# Patient Record
Sex: Female | Born: 1990 | Hispanic: Yes | Marital: Single
Health system: Southern US, Community
[De-identification: ages and names within clinical notes are randomized; demographics above are authoritative.]

---
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
## 2011-05-15 ENCOUNTER — Ambulatory Visit: Payer: Self-pay | Admitting: Family Medicine

## 2011-06-12 ENCOUNTER — Ambulatory Visit: Payer: Self-pay | Admitting: Family Medicine

## 2011-08-06 ENCOUNTER — Ambulatory Visit: Payer: Self-pay | Admitting: Family Medicine

## 2011-10-24 ENCOUNTER — Ambulatory Visit: Payer: Self-pay | Admitting: Obstetrics and Gynecology

## 2011-10-24 LAB — CBC WITH DIFFERENTIAL/PLATELET
Basophil #: 0 10*3/uL (ref 0.0–0.1)
Eosinophil #: 0 10*3/uL (ref 0.0–0.7)
Lymphocyte #: 2.2 10*3/uL (ref 1.0–3.6)
Lymphocyte %: 24.7 %
MCH: 31.4 pg (ref 26.0–34.0)
MCV: 91 fL (ref 80–100)
Monocyte #: 0.5 x10 3/mm (ref 0.2–0.9)
Platelet: 194 10*3/uL (ref 150–440)
RDW: 13.3 % (ref 11.5–14.5)

## 2011-10-25 ENCOUNTER — Inpatient Hospital Stay: Payer: Self-pay | Admitting: Obstetrics and Gynecology

## 2011-10-26 LAB — HEMATOCRIT: HCT: 30.1 % — ABNORMAL LOW (ref 35.0–47.0)

## 2013-03-06 IMAGING — US US OB US >=[ID] SNGL FETUS
1 series · 14 of 28 positions shown · non-contrast
Comparison: none

REASON FOR EXAM: Placenta location and size greater than dates
COMMENTS:

PROCEDURE:     US  - US OB GREATER/OR EQUAL TO [id number]  - August 06, 2011  [DATE]
RESULT:     Comparison: 05/15/2011
TECHNIQUE: Multiple grayscale and color Doppler images were obtained of the
pelvis.

[Series 1: us ob us >=(id) sngl fetus · 0.26mm/px · 14 of 70 slices shown]
[im 3/70]
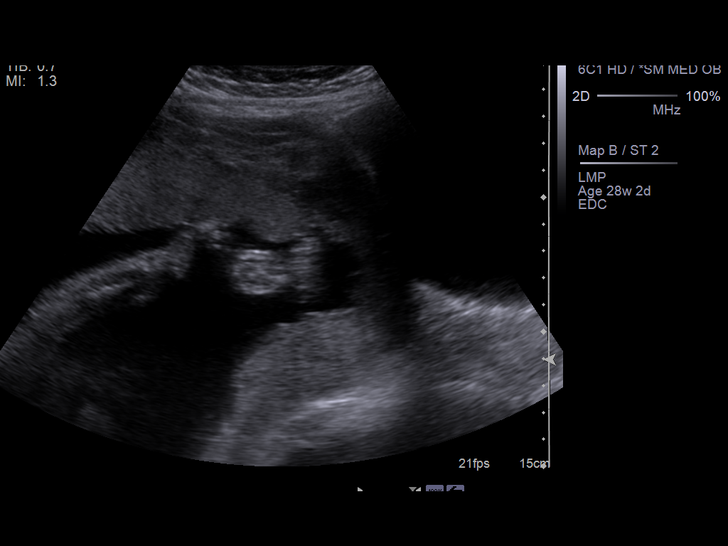
[im 8/70]
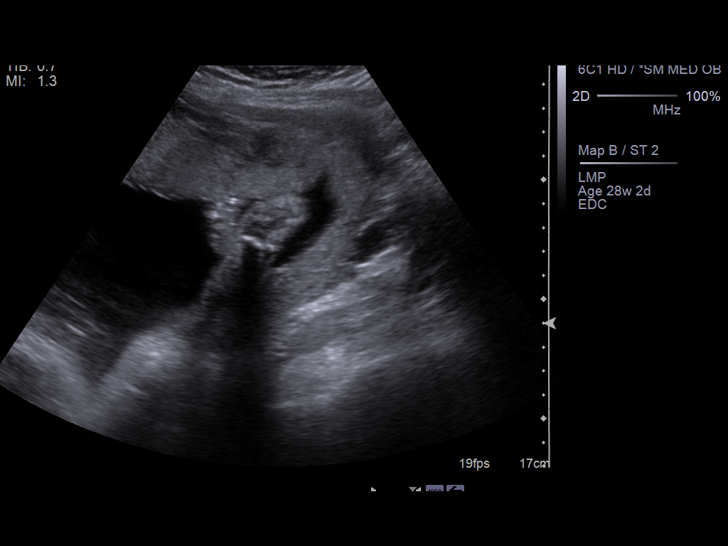
[im 13/70]
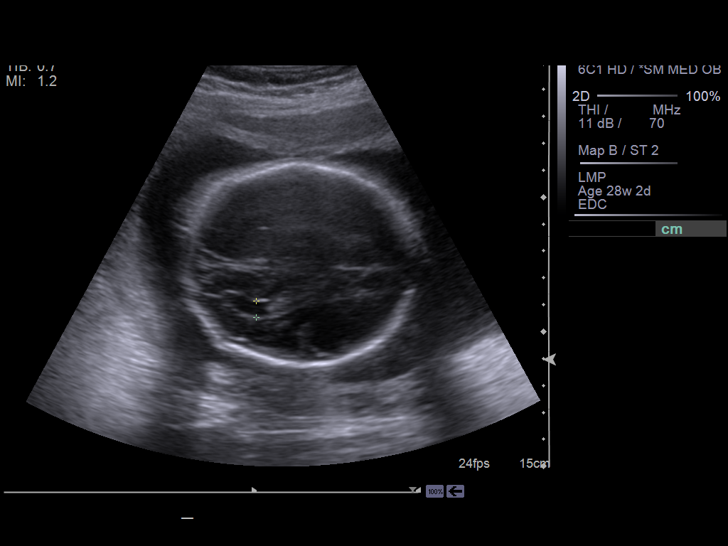
[im 18/70]
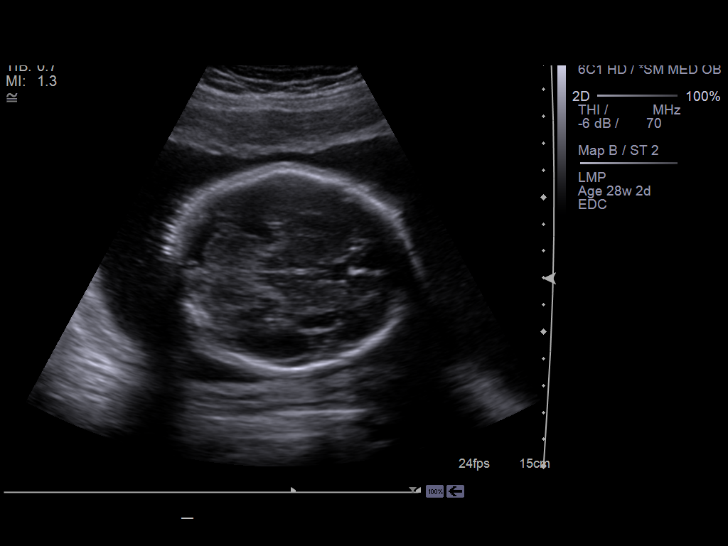
[im 24/70]
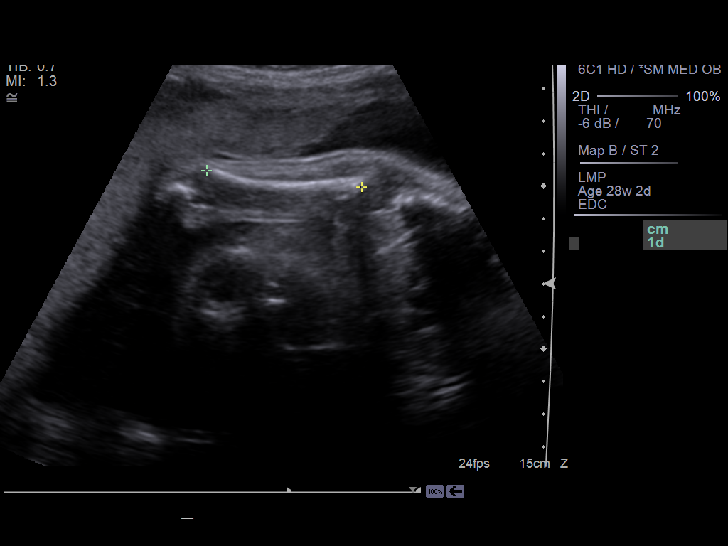
[im 29/70]
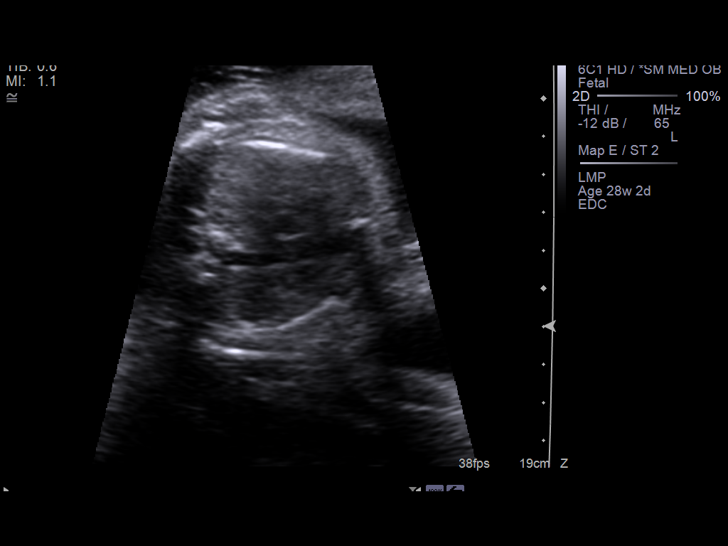
[im 34/70]
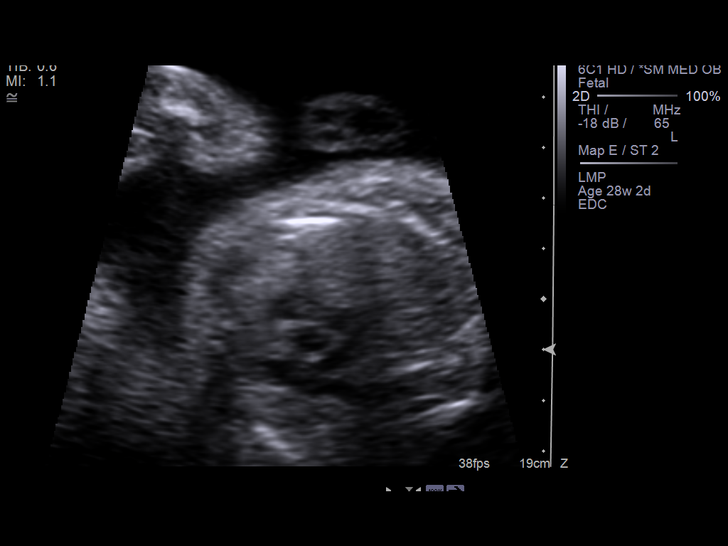
[im 39/70]
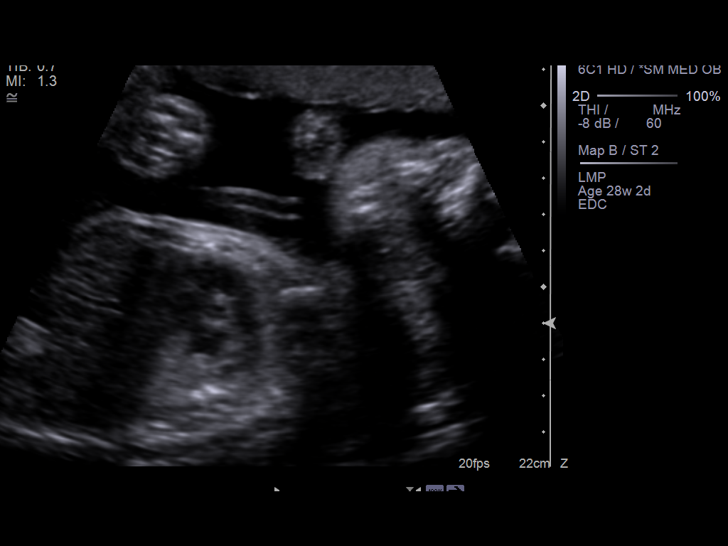
[im 44/70]
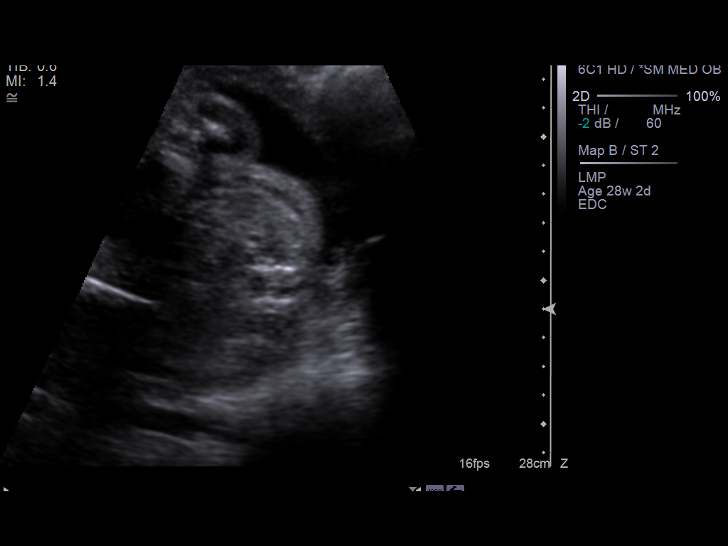
[im 49/70]
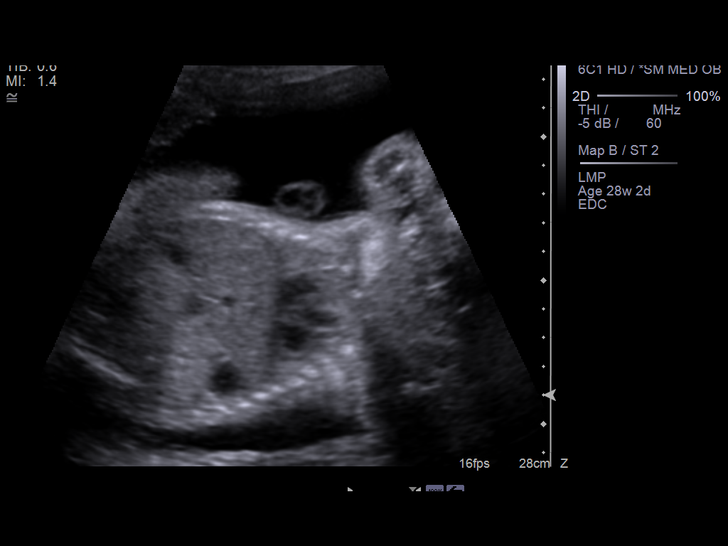
[im 54/70]
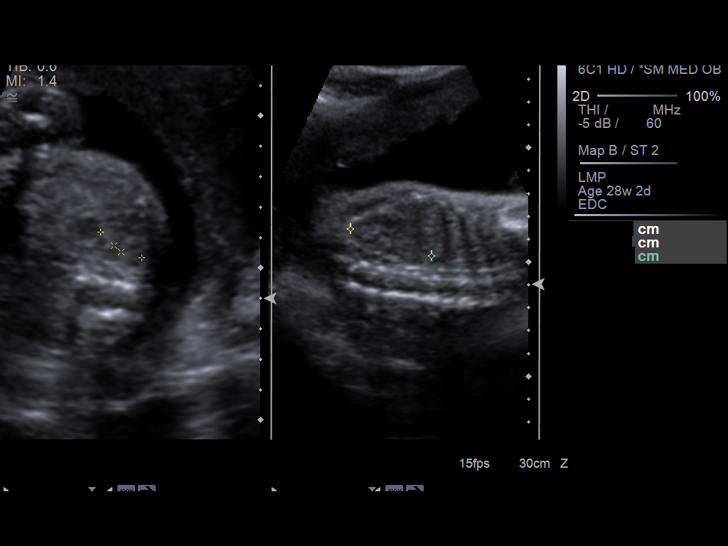
[im 59/70]
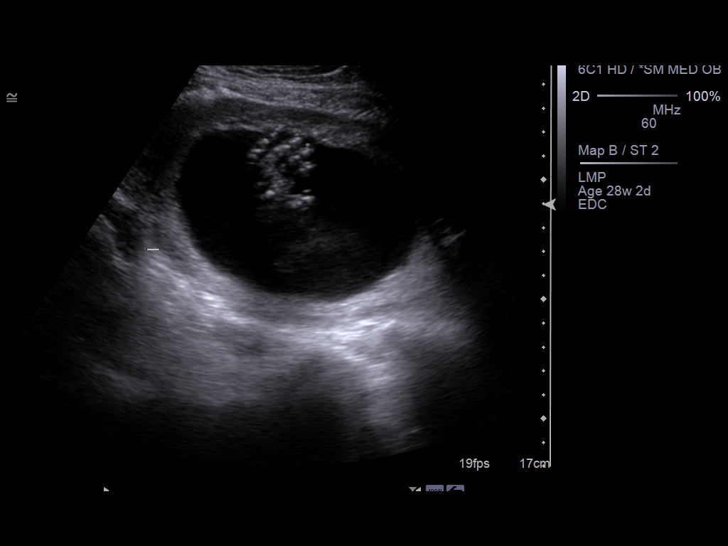
[im 64/70]
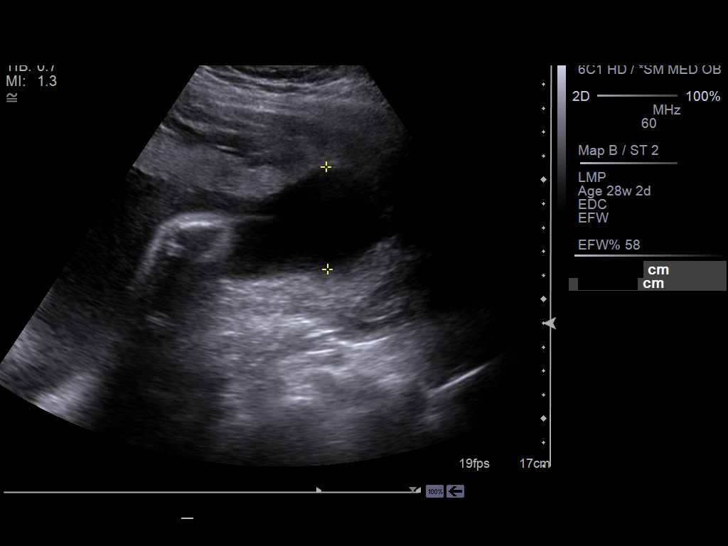
[im 70/70]
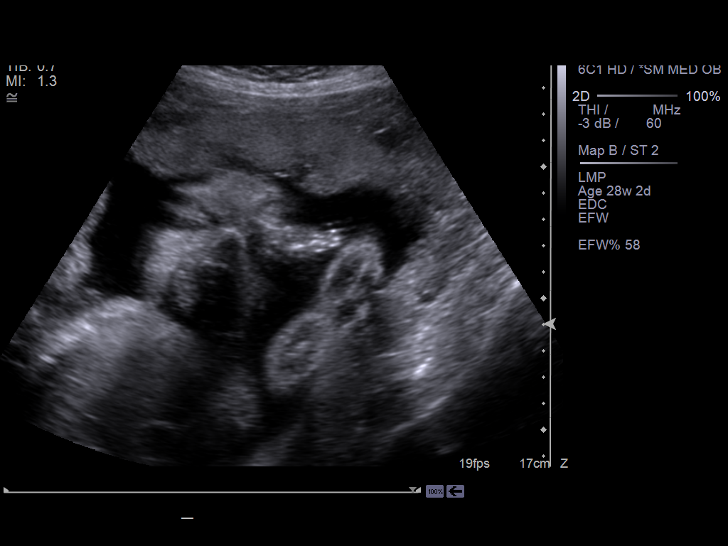

[14 of 28 positions shown; findings below may reference images not displayed]

FINDINGS: There is a single live intrauterine pregnancy with estimated gestational age
of 28 weeks 6 days. The cervix measures 5.8 cm and is closed. The placenta
is in an anterior location. The inferior border of the placenta is 3.1 cm
from the cervical os. No evidence of placenta previa. Fetal heart rate was
150 beats per minute. The amniotic fluid index was 17.2 cm, which is within
normal limits. The fetal presentation was variable. No fetal anomalies were
identified. The estimated fetal weight is 2 pounds 14 ounces + / - 7 ounces.
Estimated fetal weight percentage is 58%.
IMPRESSION: 1. Single live intrauterine pregnancy with estimated gestational age of 28
weeks 6 days.
2. No evidence of placenta previa.

## 2014-06-15 NOTE — Op Note (Signed)
PATIENT NAME:  Debra Cabrera, Debra Cabrera MR#:  [id number] DATE OF BIRTH:  03-16-1990  DATE OF PROCEDURE:  10/25/2011  PREOPERATIVE DIAGNOSIS: Elective repeat cesarean section.   POSTOPERATIVE DIAGNOSIS:   Elective repeat cesarean section.  POSTOPERATIVE DIAGNOSIS:  Repeat low transverse cesarean section.   ANESTHESIA: Spinal.   SURGEON: Suzy Bouchardhomas J Cornelio Parkerson, M.D.   FIRST ASSISTANT: Doman, certified scrub tech   INDICATIONS:  24 year old gravida 2, para 1, EDC of 10/27/2011. Patient with previous C-section has elected for repeat cesarean section.   PROCEDURE: After adequate spinal anesthesia, the patient was placed in the dorsal supine position with a hip roll under the right side. The patient was prepped and draped in normal sterile fashion. At Pfannenstiel incision was made two fingerbreadths above the symphysis pubis. Sharp dissection was used to identify the fascia. The fascia was opened in the midline and opened in a transverse fashion. The superior aspect of the fascia was grasped with Kocher clamps and the recti muscles dissected free. The inferior aspect of the fascia was grasped with Kocher clamps. Pyramidalis muscle was dissected free. The peritoneal cavity was entered sharply. There were some filmy adhesions to the lower uterus. These were taken down sharply. Bladder blade was placed after the vesicouterine peritoneal fold was identified and reflected inferiorly. A low transverse uterine incision was made. Upon entry into the endometrial cavity, bloody discharge was noted secondary to the placenta in the anterior position. Ultimately amniotic fluid was entered and was noted to be clear. Incision was extended with transverse blunt traction. The fetal head was brought to the incision, vacuum applied to the occiput, and one gentle pull allowed for fetal head delivery.  The vacuum was removed. Shoulders and body were delivered without difficulty.  A vigorous female was passed to Dr. Abundio MiuHorowitz who  assigned Apgar scores of 9 and 9. The placenta was manually delivered. The uterus was exteriorized and wiped clean with the laparotomy tape. The cervix was opened with a ring forceps and this was passed off the operative field. The uterine incision was closed with 1 chromic suture in a running locking fashion with good approximation of edges. Good hemostasis was noted. The fallopian tubes and ovaries appeared normal. There was an extremely dense omental adhesion to the right midportion of the uterus. This was taken down after clamping with Kelly clamps, transected, and doubly ligated with 0 Vicryl suture. Posterior cul-de-sac was suctioned and irrigated the uterus placed back into the endometrial cavity and the uterine incision again appeared hemostatic. The paracolic gutters were wiped clean with laparotomy tape. Fascia was closed with 0 Vicryl suture in a running nonlocking fashion with good approximation of edges. Subcutaneous tissues were irrigated and bovied for hemostasis. The skin was reapproximated with Insorb absorbable staples. There were no complications. Estimated blood was 700 mL. Intraoperative fluids were 1000 mL. The patient tolerated the procedure well and was taken to the recovery room in good condition.       ____________________________ Suzy Bouchardhomas J. Ebony Rickel, MD tjs:bjt D: 10/25/2011 11:21:59 ET T: 10/25/2011 11:50:11 ET JOB#: [id number]  cc: Suzy Bouchardhomas J. Kristianna Saperstein, MD, <Dictator> Suzy BouchardHOMAS J Ellis Koffler MD ELECTRONICALLY SIGNED 10/30/2011 8:55

## 2014-06-15 NOTE — Discharge Summary (Signed)
PATIENT NAME:  Debra Cabrera, Debra Cabrera MR#:  [id number] DATE OF BIRTH:  [birth year]  DATE OF ADMISSION:  10/25/2011 DATE OF DISCHARGE:  10/27/2011  PRINCIPAL PROCEDURE:   Elective repeat cesarean section.   HOSPITAL COURSE: The patient underwent the above procedure which was uncomplicated. Postoperative day one hematocrit was 30.1%. The patient was discharged postoperative day two in good condition.   DISCHARGE MEDICATIONS: Norco, prenatal vitamins, and iron.   FOLLOWUP: The patient will follow up with Dr. Feliberto GottronSchermerhorn in two weeks or before if she has wound drainage, discharge, fever, nausea, or vomiting,   ____________________________ Suzy Bouchardhomas J. Katrisha Segall, MD tjs:cbb D: 11/02/2011 09:17:46 ET T: 11/05/2011 10:49:30 ET JOB#: [id number]  cc: Suzy Bouchardhomas J. Abby Tucholski, MD, <Dictator> Suzy BouchardHOMAS J Cambrie Sonnenfeld MD ELECTRONICALLY SIGNED 11/06/2011 19:36

## 2019-05-23 ENCOUNTER — Ambulatory Visit: Payer: Self-pay | Attending: Internal Medicine

## 2019-05-23 DIAGNOSIS — Z23 Encounter for immunization: Secondary | ICD-10-CM

## 2019-05-23 NOTE — Progress Notes (Signed)
   Covid-19 Vaccination Clinic  Name:  Debra Cabrera    MRN: [id number] DOB: 04/15/1990  05/23/2019  Ms. Debra Cabrera was observed post Covid-19 immunization for 15 minutes without incident. She was provided with Vaccine Information Sheet and instruction to access the V-Safe system.   Ms. Debra Cabrera was instructed to call 911 with any severe reactions post vaccine: [name] Kitchen Difficulty breathing  . Swelling of face and throat  . A fast heartbeat  . A bad rash all over body  . Dizziness and weakness   Immunizations Administered    Name Date Dose VIS Date Route   Pfizer COVID-19 Vaccine 05/23/2019 11:23 AM 0.3 mL 02/06/2019 Intramuscular   Manufacturer: [employer], [employer]   Lot: [id number]   NDC: [address]-5

## 2019-06-19 ENCOUNTER — Ambulatory Visit: Payer: Self-pay | Attending: Internal Medicine

## 2019-06-19 DIAGNOSIS — Z23 Encounter for immunization: Secondary | ICD-10-CM

## 2019-06-19 NOTE — Progress Notes (Signed)
   Covid-19 Vaccination Clinic  Name:  Joannah Gitlin    MRN: [id number] DOB: 1990/06/21  06/19/2019  Ms. Raelan Burgoon was observed post Covid-19 immunization for 15 minutes without incident. She was provided with Vaccine Information Sheet and instruction to access the V-Safe system.   Ms. Javionna Leder was instructed to call 911 with any severe reactions post vaccine: [name] Kitchen Difficulty breathing  . Swelling of face and throat  . A fast heartbeat  . A bad rash all over body  . Dizziness and weakness   Immunizations Administered    Name Date Dose VIS Date Route   Pfizer COVID-19 Vaccine 06/19/2019  4:53 PM 0.3 mL 04/22/2018 Intramuscular   Manufacturer: [employer], [employer]   Lot: [id number]   NDC: [address]-0

## 2019-11-09 ENCOUNTER — Other Ambulatory Visit: Payer: Self-pay

## 2019-11-09 DIAGNOSIS — Z20822 Contact with and (suspected) exposure to covid-19: Secondary | ICD-10-CM

## 2019-11-10 LAB — SARS-COV-2, NAA 2 DAY TAT

## 2019-11-10 LAB — NOVEL CORONAVIRUS, NAA: SARS-CoV-2, NAA: NOT DETECTED
